# Patient Record
Sex: Male | Born: 1955 | Race: White | Hispanic: No | Marital: Married | Smoking: Current every day smoker
Health system: Southern US, Community
[De-identification: ages and names within clinical notes are randomized; demographics above are authoritative.]

## PROBLEM LIST (undated history)

## (undated) DIAGNOSIS — M549 Dorsalgia, unspecified: Secondary | ICD-10-CM

## (undated) DIAGNOSIS — M5430 Sciatica, unspecified side: Secondary | ICD-10-CM

## (undated) HISTORY — PX: NECK SURGERY: SHX720

---

## 2001-12-30 ENCOUNTER — Encounter: Payer: Self-pay | Admitting: Emergency Medicine

## 2001-12-30 ENCOUNTER — Emergency Department (HOSPITAL_COMMUNITY): Admission: EM | Admit: 2001-12-30 | Discharge: 2001-12-30 | Payer: Self-pay | Admitting: Emergency Medicine

## 2007-09-03 ENCOUNTER — Ambulatory Visit (HOSPITAL_COMMUNITY): Admission: RE | Admit: 2007-09-03 | Discharge: 2007-09-04 | Payer: Self-pay | Admitting: Neurosurgery

## 2010-05-16 NOTE — Op Note (Signed)
NAMEDRAYK, HUMBARGER                 ACCOUNT NO.:  [id number]   MEDICAL RECORD NO.:  [id number]          PATIENT TYPE:  OIB   LOCATION:  3534                         FACILITY:  [facility]   PHYSICIAN:  Hewitt Shorts, M.D.DATE OF BIRTH:  06-17-1955   DATE OF PROCEDURE:  DATE OF DISCHARGE:  09/04/2007                               OPERATIVE REPORT   PREOPERATIVE DIAGNOSES:  1. C3-C4 cervical disk herniation with myelopathy.  2. Cervical spondylosis with myelopathy.  3. Cervical stenosis.   POSTOPERATIVE DIAGNOSES:  1. C3-C4 cervical disk herniation with myelopathy.  2. Cervical spondylosis with myelopathy.  3. Cervical stenosis.   PROCEDURE:  C3-C4 anterior cervical decompression and arthrodesis with  allograft and tethered cervical plating.   SURGEON:  Hewitt Shorts, M.D.   ASSISTANT:  Dr. Venetia Maxon.   ANESTHESIA:  General endotracheal anesthesia.   INDICATIONS:  The patient is a 55 year old male who presented with  numbness and tingling of his hands bilaterally.  MRI scan was obtained  and showed large spondylitic disk herniation broad based at the C3-C4  level with significant spinal cord compression and significant increased  signal within the spinal cord.  Decision made to proceed with single  level decompression arthrodesis.   PROCEDURE:  The patient was brought to the operating room and placed  under general endotracheal anesthesia.  The patient was placed in 10-  pounds of Holter traction.  Neck was prepped with Betadine soap and  solution and draped in a sterile fashion.  The line of the incision was  infiltrated with local anesthetic with epinephrine.  The incision was  made in left side of his neck in a horizontal fashion.  Dissection was  carried down to the subcutaneous tissue and platysma.  Bipolar  electrocautery was used to maintain hemostasis.  Dissection was carried  down through an avascular plane leaving the sternocleidomastoid, carotid  artery, and  jugular vein laterally and trachea and esophagus medially.  The ventral aspect of the vertebral cord was identified, a localizing x-  ray taken, and the C3-C4 intervertebral disk space identified.  Diskectomy was begun at that level with incision of the annulus  continued micro curettes and pituitary rongeurs.  Anterior osteophytic  overgrowth was removed using Kerrison punches and the cauterized end  plates of the vertebrae were removed using micro curettes along with X-  Max drill.  The microscope was draped and brought into the field to  provide additional navigation, illumination, and visualization.  The  remainder of the decompression was performed using microdissection and  microsurgical technique.  There was significant spondylitic overgrowth  posteriorly.  This was removed using an X-Max drill along with 2-mm  Kerrison punch with a thin footplate.  The posterior large ligament was  carefully removed and we encountered fairly large soft disk herniation  as well as spondylitic disk herniation and all this was removed to  decompress the spinal canal and thecal sac.  Attention was then turned  to the lateral recess and neuroforamen to ensure that they were  similarly decompressed and once the decompression and diskectomy was  completed,  hemostasis was established with the use of a Gelfoam  substance thrombin.  We irrigated the Gelfoam away with gentle saline  irrigation.  Hemostasis was confirmed and then we measured the height of  the intervertebral disk space using sizers, selected a 7-mm implant.  The allograft implant was hydrated with saline solution then positioned  in the intervertebral disk space and countersunk.  We then discontinued  the cervical traction.  We selected a 14-mm Tether cervical plate.  It  was positioned over the fusion construct and secured to the vertebrae  with a pair of 4 mm x 13 mm variable-angle screws at each level.  Each  of the screw holes was drilled  and the screws were placed in alternating  fashion.  Once all four screws were in placed, final tightening was  performed.  An x-ray was taken which showed the graft, plates, and  screws in good position, the alignment was good and then the wound was  irrigated with Bacitracin solution and checked for hemostasis which was  confirmed and then we proceeded with closure.  The platysma was closed  with interrupted and inverted 2-0 undyed Vicryl sutures.  The  subcutaneous and subcuticular were closed with interrupted and inverted  3-0 undyed Vicryl sutures.  The skin was approximated with Dermabond.  The procedure was tolerated well.  The estimated blood loss was 50 mL.  Sponge and needle count were correct.  Following surgery, the patient  was placed in a soft cervical collar to be reversed from the anesthetic,  extubated, and transferred to the recovery room for further care.      Hewitt Shorts, M.D.  Electronically Signed     RWN/MEDQ  D:  09/03/2007  T:  09/05/2007  Job:  [id number]

## 2014-01-25 ENCOUNTER — Emergency Department (HOSPITAL_COMMUNITY)
Admission: EM | Admit: 2014-01-25 | Discharge: 2014-01-25 | Disposition: A | Discharge status: Home / Self Care | Payer: 59 | Attending: Emergency Medicine | Admitting: Emergency Medicine

## 2014-01-25 ENCOUNTER — Encounter (HOSPITAL_COMMUNITY): Payer: Self-pay | Admitting: Emergency Medicine

## 2014-01-25 ENCOUNTER — Emergency Department (HOSPITAL_COMMUNITY): Payer: 59

## 2014-01-25 DIAGNOSIS — Z720 Tobacco use: Secondary | ICD-10-CM | POA: Diagnosis not present

## 2014-01-25 DIAGNOSIS — M533 Sacrococcygeal disorders, not elsewhere classified: Secondary | ICD-10-CM

## 2014-01-25 DIAGNOSIS — Z79899 Other long term (current) drug therapy: Secondary | ICD-10-CM | POA: Insufficient documentation

## 2014-01-25 DIAGNOSIS — R109 Unspecified abdominal pain: Secondary | ICD-10-CM

## 2014-01-25 DIAGNOSIS — R1032 Left lower quadrant pain: Secondary | ICD-10-CM | POA: Diagnosis present

## 2014-01-25 DIAGNOSIS — K219 Gastro-esophageal reflux disease without esophagitis: Secondary | ICD-10-CM | POA: Insufficient documentation

## 2014-01-25 LAB — COMPREHENSIVE METABOLIC PANEL
ALT: 21 U/L (ref 0–53)
AST: 22 U/L (ref 0–37)
Albumin: 3.9 g/dL (ref 3.5–5.2)
Alkaline Phosphatase: 54 U/L (ref 39–117)
Anion gap: 6 (ref 5–15)
BUN: 13 mg/dL (ref 6–23)
CALCIUM: 9.4 mg/dL (ref 8.4–10.5)
CHLORIDE: 105 mmol/L (ref 96–112)
CO2: 28 mmol/L (ref 19–32)
Creatinine, Ser: 0.99 mg/dL (ref 0.50–1.35)
GFR calc Af Amer: >90 mL/min (ref >90)
GFR calc non Af Amer: 88 mL/min — ABNORMAL LOW (ref >90)
GLUCOSE: 112 mg/dL — AB (ref 70–99)
Potassium: 4.7 mmol/L (ref 3.5–5.1)
Sodium: 139 mmol/L (ref 135–145)
Total Bilirubin: 0.4 mg/dL (ref 0.3–1.2)
Total Protein: 6.3 g/dL (ref 6.0–8.3)

## 2014-01-25 LAB — CBC WITH DIFFERENTIAL/PLATELET
BASOS ABS: 0 10*3/uL (ref 0.0–0.1)
Basophils Relative: 0 % (ref 0–1)
EOS PCT: 2 % (ref 0–5)
Eosinophils Absolute: 0.1 10*3/uL (ref 0.0–0.7)
HCT: 47.2 % (ref 39.0–52.0)
Hemoglobin: 16 g/dL (ref 13.0–17.0)
Lymphocytes Relative: 22 % (ref 12–46)
Lymphs Abs: 1.6 10*3/uL (ref 0.7–4.0)
MCH: 32.8 pg (ref 26.0–34.0)
MCHC: 33.9 g/dL (ref 30.0–36.0)
MCV: 96.7 fL (ref 78.0–100.0)
Monocytes Absolute: 0.7 10*3/uL (ref 0.1–1.0)
Monocytes Relative: 10 % (ref 3–12)
Neutro Abs: 4.8 10*3/uL (ref 1.7–7.7)
Neutrophils Relative %: 66 % (ref 43–77)
Platelets: 225 10*3/uL (ref 150–400)
RBC: 4.88 MIL/uL (ref 4.22–5.81)
RDW: 13.2 % (ref 11.5–15.5)
WBC: 7.2 10*3/uL (ref 4.0–10.5)

## 2014-01-25 LAB — LIPASE, BLOOD: Lipase: 38 U/L (ref 11–59)

## 2014-01-25 LAB — URINALYSIS, ROUTINE W REFLEX MICROSCOPIC
Bilirubin Urine: NEGATIVE
GLUCOSE, UA: NEGATIVE mg/dL
Hgb urine dipstick: NEGATIVE
Ketones, ur: NEGATIVE mg/dL
LEUKOCYTES UA: NEGATIVE
NITRITE: NEGATIVE
PH: 5.5 (ref 5.0–8.0)
Protein, ur: NEGATIVE mg/dL
Specific Gravity, Urine: 1.011 (ref 1.005–1.030)
UROBILINOGEN UA: 0.2 mg/dL (ref 0.0–1.0)

## 2014-01-25 MED ORDER — PANTOPRAZOLE SODIUM 20 MG PO TBEC
20.0000 mg | DELAYED_RELEASE_TABLET | Freq: Every day | ORAL | Status: AC
Start: 2014-01-25 — End: ?

## 2014-01-25 MED ORDER — HYDROCODONE-ACETAMINOPHEN 5-325 MG PO TABS: 1.0000 | ORAL_TABLET | ORAL | Status: AC | PRN

## 2014-01-25 MED ORDER — PANTOPRAZOLE SODIUM 40 MG PO TBEC
40.0000 mg | DELAYED_RELEASE_TABLET | Freq: Once | ORAL | Status: AC
  Administered 2014-01-25: 40 mg via ORAL
  Filled 2014-01-25: qty 1

## 2014-01-25 MED ORDER — ONDANSETRON 4 MG PO TBDP: 4.0000 mg | ORAL_TABLET | Freq: Three times a day (TID) | ORAL | Status: AC | PRN

## 2014-01-25 MED ORDER — ONDANSETRON HCL 4 MG/2ML IJ SOLN
4.0000 mg | Freq: Once | INTRAMUSCULAR | Status: AC
  Administered 2014-01-25: 4 mg via INTRAVENOUS
  Filled 2014-01-25: qty 2

## 2014-01-25 MED ORDER — SODIUM CHLORIDE 0.9 % IV BOLUS (SEPSIS)
1000.0000 mL | Freq: Once | INTRAVENOUS | Status: AC
  Administered 2014-01-25: 1000 mL via INTRAVENOUS

## 2014-01-25 MED ORDER — KETOROLAC TROMETHAMINE 30 MG/ML IJ SOLN
30.0000 mg | Freq: Once | INTRAMUSCULAR | Status: AC
  Administered 2014-01-25: 30 mg via INTRAVENOUS
  Filled 2014-01-25: qty 1

## 2014-01-25 MED ORDER — GI COCKTAIL ~~LOC~~
30.0000 mL | Freq: Once | ORAL | Status: AC
  Administered 2014-01-25: 30 mL via ORAL
  Filled 2014-01-25: qty 30

## 2014-01-25 NOTE — ED Notes (Signed)
Resting quietly with eye closed. Easily arousable. Verbally responsive. Resp even and unlabored. ABC's intact. NAD noted.  

## 2014-01-25 NOTE — ED Notes (Signed)
Awake. Verbally responsive. Resp even and unlabored. No audible adventitious breath sounds noted. ABC's intact. Abd soft/nondistended but tender to palpate. BS (+) and active x4 quadrants. No N/V/D reported. 

## 2014-01-25 NOTE — ED Notes (Signed)
Awake. Verbally responsive. A/O x4. Resp even and unlabored. No audible adventitious breath sounds noted. ABC's intact.  

## 2014-01-25 NOTE — ED Notes (Signed)
Pt states that he began having a sharp pain in lower left flank yesterday afternoon. States that he went to bed and woke up in severe pain. Pt states that he has also had a dry cough tonight accompanied by severe acid reflux that has not been controlled with antacids. Denies any dysuria or trouble urinating.

## 2014-01-25 NOTE — ED Provider Notes (Addendum)
TIME SEEN: 3:15 AM  CHIEF COMPLAINT: Left flank pain  HPI: Pt is a 59 y.o. male with no significant past medical history other than tobacco use who presents to the emergency department with complaints of left flank pain that radiates into his left lower abdomen that started yesterday afternoon but then woke him from sleep tonight. He states in the pain gets very severe causes him to feel very nauseated causing him to have a dry cough accompanied with bitter, sour taste in his mouth similar to acid reflux. Denies any dysuria, hematuria. No fevers, chills. No vomiting or diarrhea. No bloody stool or melena. No penile discharge, testicular pain or swelling. No prior history of kidney stones. No midline spine tenderness or history of back injury. No numbness, tingling or focal weakness. No bowel or bladder incontinence. Denies chest pain or shortness of breath.  ROS: See HPI Constitutional: no fever  Eyes: no drainage  ENT: no runny nose   Cardiovascular:  no chest pain  Resp: no SOB  GI: no vomiting GU: no dysuria Integumentary: no rash  Allergy: no hives  Musculoskeletal: no leg swelling  Neurological: no slurred speech ROS otherwise negative  PAST MEDICAL HISTORY/PAST SURGICAL HISTORY:  History reviewed. No pertinent past medical history.  MEDICATIONS:  Prior to Admission medications   Medication Sig Start Date End Date Taking? Authorizing Provider  ALPRAZolam Prudy Feeler(XANAX) 0.25 MG tablet Take 0.25 mg by mouth at bedtime as needed for anxiety.   Yes Historical Provider, MD  calcium carbonate (TUMS - DOSED IN MG ELEMENTAL CALCIUM) 500 MG chewable tablet Chew 1 tablet by mouth once.   Yes Historical Provider, MD  ibuprofen (ADVIL,MOTRIN) 200 MG tablet Take 400 mg by mouth once.   Yes Historical Provider, MD    ALLERGIES:  No Known Allergies  SOCIAL HISTORY:  History  Substance Use Topics  . Smoking status: Current Every Day Smoker -- 0.50 packs/day    Types: Cigarettes  . Smokeless  tobacco: Never Used  . Alcohol Use: Yes     Comment: occasionally    FAMILY HISTORY: No family history on file.  EXAM: BP 144/85 mmHg  Pulse 94  Temp(Src) 97.8 F (36.6 C) (Oral)  Resp 20  SpO2 98% CONSTITUTIONAL: Alert and oriented and responds appropriately to questions. Well-appearing; well-nourished HEAD: Normocephalic EYES: Conjunctivae clear, PERRL ENT: normal nose; no rhinorrhea; moist mucous membranes; pharynx without lesions noted NECK: Supple, no meningismus, no LAD  CARD: RRR; S1 and S2 appreciated; no murmurs, no clicks, no rubs, no gallops RESP: Normal chest excursion without splinting or tachypnea; breath sounds clear and equal bilaterally; no wheezes, no rhonchi, no rales,  ABD/GI: Normal bowel sounds; non-distended; soft, non-tender, no rebound, no guarding BACK:  The back appears normal and is non-tender to palpation, there is no CVA tenderness; no midline spinal tennis or step-off or deformity; tender to palpation over the SI joint on the left side EXT: Normal ROM in all joints; non-tender to palpation; no edema; normal capillary refill; no cyanosis, equal pulses in all 4 extremities    SKIN: Normal color for age and race; warm NEURO: Moves all extremities equally, sensation to light touch intact diffusely, normal gait PSYCH: The patient's mood and manner are appropriate. Grooming and personal hygiene are appropriate.  MEDICAL DECISION MAKING: Patient here with left flank pain radiating into his left lower abdomen. Suspect kidney stone. We'll give IV fluids, Toradol, Zofran. We'll obtain labs, urinalysis, CT of his abdomen and pelvis without contrast.  ED PROGRESS: Patient's  urine shows no sign of infection. CT scan shows no kidney stone or other acute intra-abdominal or pelvic process. 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 He also has a 4 mm right pulmonary nodule. Discussed both of  these findings with the patient and the importance of outpatient follow-up. He has no right-sided abdominal pain, no right leg pain. 2+ DP pulses and radial pulses bilaterally. Labs unremarkable. I suspect his pain is secondary to sacroiliitis. We'll discharge home with prescription for Vicodin. He reports his heartburn is completely gone after GI cocktail, Zofran, Protonix.  Discussed return precautions. He verbalizes understanding and is comfortable with plan.        Layla Maw Giavonni Fonder, DO 01/25/14 [id number]  Layla Maw Samul Mcinroy, DO 01/25/14 1191

## 2014-01-25 NOTE — Discharge Instructions (Signed)
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 Both of these will need to be followed by her primary care physician. We're also providing you with vascular surgery follow-up information. If you develop severe pain in the right leg, a cold or blue leg, numbness in this leg, I recommend you return to the hospital immediately.   Sacroiliac Joint Dysfunction The sacroiliac joint connects the lower part of the spine (the sacrum) with the bones of the pelvis. CAUSES  Sometimes, there is no obvious reason for sacroiliac joint dysfunction. Other times, it may occur   During pregnancy.  After injury, such as:  Car accidents.  Sport-related injuries.  Work-related injuries.  Due to one leg being shorter than the other.  Due to other conditions that affect the joints, such as:  Rheumatoid arthritis.  Gout.  Psoriasis.  Joint infection (septic arthritis). SYMPTOMS  Symptoms may include:  Pain in the:  Lower back.  Buttocks.  Groin.  Thighs and legs.  Difficult sitting, standing, walking, lying, bending or lifting. DIAGNOSIS  A number of tests may be used to help diagnose the cause of sacroiliac joint dysfunction, including:  Imaging tests to look for other causes of pain, including:  MRI.  CT scan.  Bone scan.  Diagnostic injection: During a special x-ray (called fluoroscopy), a needle is put into the sacroiliac joint. A numbing medicine is injected into the joint. If the pain is improved or stopped, the diagnosis of sacroiliac joint dysfunction is more likely. TREATMENT  There are a number of types of treatment used for sacroiliac joint dysfunction, including:  Only take over-the-counter or prescription medicines for pain, discomfort, or fever as directed by your caregiver.  Medications to relax muscles.  Rest. Decreasing activity can help cut down on painful muscle spasms and allow the back to heal.  Application of heat or ice  to the lower back may improve muscle spasms and soothe pain.  Brace. A special back brace, called a sacroiliac belt, can help support the joint while your back is healing.  Physical therapy can help teach comfortable positions and exercises to strengthen muscles that support the sacroiliac joint.  Cortisone injections. Injections of steroid medicine into the joint can help decrease swelling and improve pain.  Hyaluronic acid injections. This chemical improves lubrication within the sacroiliac joint, thereby decreasing pain.  Radiofrequency ablation. A special needle is placed into the joint, where it burns away nerves that are carrying pain messages from the joint.  Surgery. Because pain occurs during movement of the joint, screws and plates may be installed in order to limit or prevent joint motion. HOME CARE INSTRUCTIONS   Take all medications exactly as directed.  Follow instructions regarding both rest and physical activity, to avoid worsening the pain.  Do physical therapy exercises exactly as prescribed. SEEK IMMEDIATE MEDICAL CARE IF:  You experience increasingly severe pain.  You develop new symptoms, such as numbness or tingling in your legs or feet.  You lose bladder or bowel control. Document Released: 03/16/2008 Document Revised: 03/12/2011 Document Reviewed: 03/16/2008 [facility] Patient Information 2015 [place], [place]. This information is not intended to replace advice given to you by your health care provider. Make sure you discuss any questions you have with your health care provider.     Heartburn Heartburn is a painful, burning sensation in the chest. It may feel worse in certain positions, such as lying down or bending over. It is caused by stomach acid backing up into  the tube that carries food from the mouth down to the stomach (lower esophagus).  CAUSES   Large meals.  Certain foods and drinks.  Exercise.  Increased acid production.  Being overweight  or obese.  Certain medicines. SYMPTOMS   Burning pain in the chest or lower throat.  Bitter taste in the mouth.  Coughing. DIAGNOSIS  If the usual treatments for heartburn do not improve your symptoms, then tests may be done to see if there is another condition present. Possible tests may include:  X-rays.  Endoscopy. This is when a tube with a light and a camera on the end is used to examine the esophagus and the stomach.  A test to measure the amount of acid in the esophagus (pH test).  A test to see if the esophagus is working properly (esophageal manometry).  Blood, breath, or stool tests to check for bacteria that cause ulcers. TREATMENT   Your caregiver may tell you to use certain over-the-counter medicines (antacids, acid reducers) for mild heartburn.  Your caregiver may prescribe medicines to decrease the acid in your stomach or protect your stomach lining.  Your caregiver may recommend certain diet changes.  For severe cases, your caregiver may recommend that the head of your bed be elevated on blocks. (Sleeping with more pillows is not an effective treatment as it only changes the position of your head and does not improve the main problem of stomach acid refluxing into the esophagus.) HOME CARE INSTRUCTIONS   Take all medicines as directed by your caregiver.  Raise the head of your bed by putting blocks under the legs if instructed to by your caregiver.  Do not exercise right after eating.  Avoid eating 2 or 3 hours before bed. Do not lie down right after eating.  Eat small meals throughout the day instead of 3 large meals.  Stop smoking if you smoke.  Maintain a healthy weight.  Identify foods and beverages that make your symptoms worse and avoid them. Foods you may want to avoid include:  Peppers.  Chocolate.  High-fat foods, including fried foods.  Spicy foods.  Garlic and onions.  Citrus fruits, including oranges, grapefruit, lemons, and  limes.  Food containing tomatoes or tomato products.  Mint.  Carbonated drinks, caffeinated drinks, and alcohol.  Vinegar. SEEK IMMEDIATE MEDICAL CARE IF:  You have severe chest pain that goes down your arm or into your jaw or neck.  You feel sweaty, dizzy, or lightheaded.  You are short of breath.  You vomit blood.  You have difficulty or pain with swallowing.  You have bloody or black, tarry stools.  You have episodes of heartburn more than 3 times a week for more than 2 weeks. MAKE SURE YOU:  Understand these instructions.  Will watch your condition.  Will get help right away if you are not doing well or get worse. Document Released: 05/06/2008 Document Revised: 03/12/2011 Document Reviewed: 06/04/2010 [facility] Patient Information 2015 [place], [place]. This information is not intended to replace advice given to you by your health care provider. Make sure you discuss any questions you have with your health care provider.

## 2014-01-27 ENCOUNTER — Telehealth: Payer: Self-pay

## 2014-01-27 NOTE — Telephone Encounter (Signed)
Phone call from pt's daughter.  Requesting an appt. ASAP for evaluation of an aneurysm.  Reported pt. 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  Noted size of aneurysm 1.6 cm.  Daughter reported that the pt. Has been awake the last 3 nights in pain, and requested an evaluation with vascular surgery.  Discussed with Dr. Imogene Burnhen.  Advised an aneurysm of 1.6 in Iliac artery is very small, and this would not be treated until it was at 3.5 cm; also stated the 1.6 cm. CIA aneurysm is unlikely the source of the pt's pain.  Advised to schedule appt. for evaluation in next 2-4 weeks.  Call placed to daughter.  Advised of Dr. Nicky Pughhen's recommendation. Recommended to daughter to call pt's PCP for further evaluation of his pain.  Informed daughter that the scheduler will call back with an appt. with Vascular Surgeon.  Verb. Understanding.

## 2014-02-11 ENCOUNTER — Encounter: Payer: PRIVATE HEALTH INSURANCE | Admitting: Vascular Surgery

## 2014-03-01 ENCOUNTER — Encounter: Payer: Self-pay | Admitting: Vascular Surgery

## 2014-03-02 ENCOUNTER — Encounter: Payer: PRIVATE HEALTH INSURANCE | Admitting: Vascular Surgery

## 2014-03-22 ENCOUNTER — Encounter: Payer: Self-pay | Admitting: Vascular Surgery

## 2014-03-23 ENCOUNTER — Encounter: Payer: PRIVATE HEALTH INSURANCE | Admitting: Vascular Surgery

## 2014-09-23 ENCOUNTER — Encounter (HOSPITAL_COMMUNITY): Payer: Self-pay | Admitting: Emergency Medicine

## 2014-09-23 ENCOUNTER — Emergency Department (HOSPITAL_COMMUNITY)
Admission: EM | Admit: 2014-09-23 | Discharge: 2014-09-23 | Disposition: A | Discharge status: Home / Self Care | Payer: PRIVATE HEALTH INSURANCE | Attending: Emergency Medicine | Admitting: Emergency Medicine

## 2014-09-23 ENCOUNTER — Emergency Department (HOSPITAL_COMMUNITY): Payer: PRIVATE HEALTH INSURANCE

## 2014-09-23 DIAGNOSIS — M542 Cervicalgia: Secondary | ICD-10-CM | POA: Insufficient documentation

## 2014-09-23 DIAGNOSIS — Z720 Tobacco use: Secondary | ICD-10-CM | POA: Insufficient documentation

## 2014-09-23 DIAGNOSIS — M545 Low back pain, unspecified: Secondary | ICD-10-CM

## 2014-09-23 DIAGNOSIS — M549 Dorsalgia, unspecified: Secondary | ICD-10-CM | POA: Diagnosis present

## 2014-09-23 HISTORY — DX: Sciatica, unspecified side: M54.30

## 2014-09-23 HISTORY — DX: Dorsalgia, unspecified: M54.9

## 2014-09-23 LAB — I-STAT CHEM 8, ED
BUN: 14 mg/dL (ref 6–20)
Calcium, Ion: 1.17 mmol/L (ref 1.12–1.23)
Chloride: 102 mmol/L (ref 101–111)
Creatinine, Ser: 0.9 mg/dL (ref 0.61–1.24)
Glucose, Bld: 103 mg/dL — ABNORMAL HIGH (ref 65–99)
HEMATOCRIT: 50 % (ref 39.0–52.0)
HEMOGLOBIN: 17 g/dL (ref 13.0–17.0)
POTASSIUM: 3.8 mmol/L (ref 3.5–5.1)
SODIUM: 137 mmol/L (ref 135–145)
TCO2: 23 mmol/L (ref 0–100)

## 2014-09-23 MED ORDER — KETOROLAC TROMETHAMINE 30 MG/ML IJ SOLN
30.0000 mg | Freq: Once | INTRAMUSCULAR | Status: AC
  Administered 2014-09-23: 30 mg via INTRAMUSCULAR

## 2014-09-23 MED ORDER — OXYCODONE-ACETAMINOPHEN 5-325 MG PO TABS: 2.0000 | ORAL_TABLET | ORAL | Status: AC | PRN

## 2014-09-23 MED ORDER — OXYCODONE-ACETAMINOPHEN 5-325 MG PO TABS: 2.0000 | ORAL_TABLET | ORAL | Status: DC | PRN

## 2014-09-23 MED ORDER — KETOROLAC TROMETHAMINE 60 MG/2ML IM SOLN
60.0000 mg | Freq: Once | INTRAMUSCULAR | Status: AC
  Administered 2014-09-23: 60 mg via INTRAMUSCULAR
  Filled 2014-09-23: qty 2

## 2014-09-23 MED ORDER — KETOROLAC TROMETHAMINE 30 MG/ML IJ SOLN
30.0000 mg | Freq: Once | INTRAMUSCULAR | Status: DC
  Filled 2014-09-23: qty 1

## 2014-09-23 MED ORDER — POLYETHYLENE GLYCOL 3350 17 G PO PACK: 17.0000 g | PACK | Freq: Every day | ORAL | Status: AC

## 2014-09-23 MED ORDER — HYDROMORPHONE HCL 1 MG/ML IJ SOLN
1.0000 mg | Freq: Once | INTRAMUSCULAR | Status: AC
  Administered 2014-09-23: 1 mg via INTRAMUSCULAR
  Filled 2014-09-23: qty 1

## 2014-09-23 MED ORDER — PREDNISONE 20 MG PO TABS: ORAL_TABLET | ORAL | Status: DC

## 2014-09-23 MED ORDER — CYCLOBENZAPRINE HCL 10 MG PO TABS
10.0000 mg | ORAL_TABLET | Freq: Once | ORAL | Status: AC
  Administered 2014-09-23: 10 mg via ORAL
  Filled 2014-09-23: qty 1

## 2014-09-23 MED ORDER — CYCLOBENZAPRINE HCL 10 MG PO TABS: 10.0000 mg | ORAL_TABLET | Freq: Two times a day (BID) | ORAL | Status: DC | PRN

## 2014-09-23 MED ORDER — PREDNISONE 20 MG PO TABS: ORAL_TABLET | ORAL | Status: AC

## 2014-09-23 MED ORDER — DOCUSATE SODIUM 100 MG PO CAPS: 100.0000 mg | ORAL_CAPSULE | Freq: Two times a day (BID) | ORAL | Status: AC

## 2014-09-23 MED ORDER — DOCUSATE SODIUM 100 MG PO CAPS: 100.0000 mg | ORAL_CAPSULE | Freq: Two times a day (BID) | ORAL | Status: DC

## 2014-09-23 MED ORDER — CYCLOBENZAPRINE HCL 10 MG PO TABS: 10.0000 mg | ORAL_TABLET | Freq: Two times a day (BID) | ORAL | Status: AC | PRN

## 2014-09-23 MED ORDER — OXYCODONE HCL 5 MG PO TABS
5.0000 mg | ORAL_TABLET | Freq: Once | ORAL | Status: AC
  Administered 2014-09-23: 5 mg via ORAL
  Filled 2014-09-23: qty 1

## 2014-09-23 NOTE — Discharge Instructions (Signed)
Herniated Disk  A herniated disk occurs when a disk in your spine bulges out too far. This condition is also called a ruptured disk or slipped disk. Your spine (backbone) is made up of bones called vertebrae. Between each pair of vertebrae is an oval disk with a soft, spongy center that acts as a shock absorber when you move. The spongy center is surrounded by a tough outer ring.  When you have a herniated disk, the spongy center of the disk bulges out or ruptures through the outer ring. A herniated disk can press on a nerve between your vertebrae and cause pain. A herniated disk can occur anywhere in your back or neck area, but the lower back is the most common spot.  CAUSES   In many cases, a herniated disk occurs just from getting older. As you age, the spongy insides of your disks tend to shrink and dry out. A herniated disk can result from gradual wear and tear. Injury or sudden strain can also cause a herniated disk.   RISK FACTORS  Aging is the main risk factor for a herniated disk. Other risk factors include:   Being a man between the ages of 30 and 50 years.   Having a job that requires heavy lifting, bending, or twisting.   Having a job that requires long hours of driving.   Not getting enough exercise.   Being overweight.   Smoking.  SIGNS AND SYMPTOMS   Signs and symptoms depend on which disk is herniated.   For a herniated disk in the lower back, you may have sharp pain in:   One part of your leg, hip, or buttocks.   The back of your calf.   The top or sole of your foot (sciatica).    For a herniated disk in the neck, you may feel pain:   When you move your neck.   Near or over your shoulder blade.   That moves to your upper arm, forearm, or fingers.    You may also have muscle weakness. It may be hard to:   Lift your leg or arm.   Stand on your toes.   Squeeze tightly with one of your hands.   Other symptoms can include:   Numbness or tingling in the affected areas of your  body.   Loss of bladder or bowel control. This is a rare but serious sign of a severe herniated disk in the lower back.  DIAGNOSIS   Your health care provider will do a physical exam. During this exam, you may have to move certain body parts or assume various positions. For example, your health care provider may do the straight-leg test. This is a good way to test for a herniated disk in your lower back. In this test, the health care provider lifts your leg while you lie on your back. This is to see if you feel pain down your leg. Your health care provider will also check for numbness or loss of feeling.   Your health care provider will also check your:   Reflexes.   Muscle strength.   Posture.   Other tests may be done to help in making a diagnosis. These may include:   An X-ray of the spine to rule out other causes of back pain.    Other imaging studies, such as an MRI or CT scan. This is to check whether the herniated disk is pressing on your spinal canal.   Electromyography (EMG). This test   checks the nerves that control muscles. It is sometimes used to identify the specific area of nerve involvement.   TREATMENT   In many cases, herniated disk symptoms go away over a period of days or weeks. You will most likely be free of symptoms in 3-4 months. Treatment may include the following:   The initial treatment for a herniated disk is ashort period of rest.   Bed rest is often limited to 1 or 2 days. Resting for too long delays recovery.   If you have a herniated disk in your lower back, you should avoid sitting as much as possible because sitting increases pressure on the disk.   Medicines. These may include:    Nonsteroidal anti-inflammatory drugs (NSAIDs).   Muscle relaxants for back spasms.   Narcotic pain medicine if your pain is very bad.    Steroid injections. You may need these along the involved nerve root to help control pain. The steroid is injected in the area of the herniated disk.  It helps by reducing swelling around the disk.   Physical therapy. This may include exercises to strengthen the muscles that help support your spine.    You may need surgery if other treatments do not work.   HOME CARE INSTRUCTIONS  Follow all your health care provider's instructions. These may include:   Take all medicines as directed by your health care provider.   Rest for 2 days and then start moving.   Do not sit or stand for long periods of time.   Maintain good posture when sitting and standing.   Avoid movements that cause pain, such as bending or lifting.   When you are able to start lifting things again:   Bend with your knees.   Keep your back straight.   Hold heavy objects close to your body.   If you are overweight, ask your health care provider to help you start a weight-loss program.   When you are able to start exercising, ask your health care provider how much and what type of exercise is best for you.   Work with a physical therapist on stretching and strengthening exercises for your back.   Do not wear high-heeled shoes.   Do not sleep on your belly.   Do not smoke.   Keep all follow-up visits as directed by your health care provider.  SEEK MEDICAL CARE IF:   You have back or neck pain that is not getting better after 4 weeks.   You have very bad pain in your back or neck.   You develop numbness, tingling, or weakness along with pain.  SEEK IMMEDIATE MEDICAL CARE IF:    You have numbness, tingling, or weakness that makes you unable to use your arms or legs.   You lose control of your bladder or bowels.   You have dizziness or fainting.   You have shortness of breath.   MAKE SURE YOU:    Understand these instructions.   Will watch your condition.   Will get help right away if you are not doing well or get worse.  Document Released: 12/16/1999 Document Revised: 05/04/2013 Document Reviewed: 11/21/2012  ExitCare Patient Information 2015 [employer]. This information  is not intended to replace advice given to you by your health care provider. Make sure you discuss any questions you have with your health care provider.    Constipation  Constipation is when a person has fewer than three bowel movements a week, has difficulty having   a bowel movement, or has stools that are dry, hard, or larger than normal. As people grow older, constipation is more common. If you try to fix constipation with medicines that make you have a bowel movement (laxatives), the problem may get worse. Long-term laxative use may cause the muscles of the colon to become weak. A low-fiber diet, not taking in enough fluids, and taking certain medicines may make constipation worse.   CAUSES    Certain medicines, such as antidepressants, pain medicine, iron supplements, antacids, and water pills.    Certain diseases, such as diabetes, irritable bowel syndrome (IBS), thyroid disease, or depression.    Not drinking enough water.    Not eating enough fiber-rich foods.    Stress or travel.    Lack of physical activity or exercise.    Ignoring the urge to have a bowel movement.    Using laxatives too much.   SIGNS AND SYMPTOMS    Having fewer than three bowel movements a week.    Straining to have a bowel movement.    Having stools that are hard, dry, or larger than normal.    Feeling full or bloated.    Pain in the lower abdomen.    Not feeling relief after having a bowel movement.   DIAGNOSIS   Your health care provider will take a medical history and perform a physical exam. Further testing may be done for severe constipation. Some tests may include:   A barium enema X-ray to examine your rectum, colon, and, sometimes, your small intestine.    A sigmoidoscopy to examine your lower colon.    A colonoscopy to examine your entire colon.  TREATMENT   Treatment will depend on the severity of your constipation and what is causing it. Some dietary treatments include drinking more fluids  and eating more fiber-rich foods. Lifestyle treatments may include regular exercise. If these diet and lifestyle recommendations do not help, your health care provider may recommend taking over-the-counter laxative medicines to help you have bowel movements. Prescription medicines may be prescribed if over-the-counter medicines do not work.   HOME CARE INSTRUCTIONS    Eat foods that have a lot of fiber, such as fruits, vegetables, whole grains, and beans.   Limit foods high in fat and processed sugars, such as french fries, hamburgers, cookies, candies, and soda.    A fiber supplement may be added to your diet if you cannot get enough fiber from foods.    Drink enough fluids to keep your urine clear or pale yellow.    Exercise regularly or as directed by your health care provider.    Go to the restroom when you have the urge to go. Do not hold it.    Only take over-the-counter or prescription medicines as directed by your health care provider. Do not take other medicines for constipation without talking to your health care provider first.   SEEK IMMEDIATE MEDICAL CARE IF:    You have bright red blood in your stool.    Your constipation lasts for more than 4 days or gets worse.    You have abdominal or rectal pain.    You have thin, pencil-like stools.    You have unexplained weight loss.  MAKE SURE YOU:    Understand these instructions.   Will watch your condition.   Will get help right away if you are not doing well or get worse.  Document Released: 09/16/2003 Document Revised: 12/23/2012 Document Reviewed: 09/29/2012  Exit[employer] Patient   Information 2015 [employer]. This information is not intended to replace advice given to you by your health care provider. Make sure you discuss any questions you have with your health care provider.

## 2014-09-23 NOTE — Progress Notes (Signed)
EDCM spoke to patient at bedside.  Patient confirms his pcp is Dr. Marjory Lies at [facility] in [place].  System updated.

## 2014-09-23 NOTE — ED Notes (Signed)
Pt in MRI.

## 2014-09-23 NOTE — ED Provider Notes (Addendum)
CSN: [id number]     Arrival date & time 09/23/14  1313 History   First MD Initiated Contact with Patient 09/23/14 1428     Chief Complaint  Patient presents with  . Back Pain     (Consider location/radiation/quality/duration/timing/severity/associated sxs/prior Treatment) HPI The patient has had occasional problems with back pain, but nothing severe like this. He reports normally he might get a sore lower back after golfing but it resolves within a day or so. The patient reports he's been in severe, unrelenting pain for 4 days now. He bent over on Saturday and something in his back began to hurt. Since then he hasn't been able to find a comfortable position and has intense pain in his central lower back is radiating out to the sides. He reports if he lies flat his back goes into spasms. He has been trying ibuprofen and muscle relaxers that were prescribed over the phone by his primary provider, he is not getting any relief. Patient reports also he's had decreased bowel movements. He reports his last bowel movement was approximately 4 days ago. He denies urinary problems with pain burning or urgency. Past Medical History  Diagnosis Date  . Back pain   . Sciatica    Past Surgical History  Procedure Laterality Date  . Neck surgery     No family history on file. Social History  Substance Use Topics  . Smoking status: Current Every Day Smoker -- 0.50 packs/day    Types: Cigarettes  . Smokeless tobacco: Never Used  . Alcohol Use: Yes     Comment: occasionally    Review of Systems 10 Systems reviewed and are negative for acute change except as noted in the HPI.    Allergies  Review of patient's allergies indicates no known allergies.  Home Medications   Prior to Admission medications   Medication Sig Start Date End Date Taking? Authorizing Provider  ALPRAZolam Prudy Feeler) 0.5 MG tablet Take 1 mg by mouth at bedtime as needed. 08/25/14  Yes Historical Provider, MD  cyclobenzaprine  (FLEXERIL) 10 MG tablet Take 10-20 mg by mouth every 4 (four) hours as needed for muscle spasms.  09/20/14  Yes Historical Provider, MD  ibuprofen (ADVIL,MOTRIN) 200 MG tablet Take 400 mg by mouth every 6 (six) hours as needed for moderate pain.    Yes Historical Provider, MD  HYDROcodone-acetaminophen (NORCO/VICODIN) 5-325 MG per tablet Take 1 tablet by mouth every 4 (four) hours as needed. Patient not taking: Reported on 09/23/2014 01/25/14   Kristen N Ward, DO  ondansetron (ZOFRAN ODT) 4 MG disintegrating tablet Take 1 tablet (4 mg total) by mouth every 8 (eight) hours as needed for nausea or vomiting. Patient not taking: Reported on 09/23/2014 01/25/14   Kristen N Ward, DO  pantoprazole (PROTONIX) 20 MG tablet Take 1 tablet (20 mg total) by mouth daily. Patient not taking: Reported on 09/23/2014 01/25/14   Kristen N Ward, DO   BP 118/87 mmHg  Pulse 102  Temp(Src) 97.6 F (36.4 C) (Oral)  Resp 20  SpO2 99% Physical Exam  Constitutional: He is oriented to person, place, and time. He appears well-developed and well-nourished.  Patient is a well-developed, well conditioned male. He is leaning against the edge of the stretcher in a very uncomfortable-appearing position. Intermittently he tries to shift his weight or stand and another position. He has no respiratory distress. His mental status is clear.  HENT:  Head: Normocephalic and atraumatic.  Eyes: EOM are normal. Pupils are equal, round, and reactive  to light.  Neck: Neck supple.  Cardiovascular: Normal rate, regular rhythm, normal heart sounds and intact distal pulses.   Pulmonary/Chest: Effort normal and breath sounds normal.  Abdominal: Soft. Bowel sounds are normal. He exhibits no distension. There is no tenderness.  Musculoskeletal: Normal range of motion. He exhibits tenderness. He exhibits no edema.  Patient appears to be having muscular spasms in his back. Attempting to lie in a supine position patient had extreme pain. He does have  intact muscular strength. He is able to stand in reposition himself with pain limiting factors. He does have intact flexion and extension of the feet and sensation intact to light touch. Back has minimally reproducible areas of discomfort in the central lumbar spine and paraspinous muscles in the L4-5 region. There is no rash on the back and no soft tissue abnormality  Neurological: He is alert and oriented to person, place, and time. He has normal strength. Coordination normal. GCS eye subscore is 4. GCS verbal subscore is 5. GCS motor subscore is 6.  Skin: Skin is warm, dry and intact.  Psychiatric: He has a normal mood and affect.    ED Course  Procedures (including critical care time) Labs Review Labs Reviewed - No data to display  Imaging Review No results found. I have personally reviewed and evaluated these images and lab results as part of my medical decision-making.   EKG Interpretation None      Recheck at 1540. Patient is significantly improved after Dilaudid and Toradol. He is now able to lie on the stretcher. He reports there is still some discomfort in his back but it is much more tolerable and he is now able to lie down and remain still.  MDM   Final diagnoses:  None   Diagnosis: Suspected herniated lumbar disc. Severe Low back pain. Patient has pain that at this point is suggestive of disc herniation. Plan will be for MRI. Patient's MRI will be followed up by Dr. Adela Lank. If no immediately surgical findings are noted on the MRI, I do feel patient will be adequately pain controlled for further outpatient treatment with prescriptions of prednisone taper, Percocet and Flexeril. Patient reports his last bowel movement was approximate 4 days ago. He is also given for special for Colace and MiraLAX as he will be using narcotic pain medications for back pain.    Arby Barrette, MD 09/23/14 1610  Arby Barrette, MD 09/23/14 [phone]

## 2014-09-23 NOTE — ED Notes (Signed)
Awake. Verbally responsive. A/O x4. Resp even and unlabored. No audible adventitious breath sounds noted. ABC's intact. Family at bedside. 

## 2014-09-23 NOTE — ED Notes (Signed)
Per pt, states he has been in bed for 5 days related to back pain-started on Saturday

## 2014-09-23 NOTE — ED Notes (Signed)
Pt reported having lower back s/p to bending over on Saturday and pain has gotten worse despite of taking muscle relaxants and Motrin. Pt reported no BM since Saturday and decrease appetite. Denies urinary problems.

## 2014-09-23 NOTE — ED Notes (Signed)
Previous noted put in by this nurse and Cala Bradford, RN

## 2014-09-23 NOTE — ED Notes (Signed)
Pt remains in MRI 

## 2017-03-13 IMAGING — MR MR LUMBAR SPINE W/O CM
4 of 5 series · 18 of 48 positions shown · non-contrast
Comparison: CT abdomen and pelvis 01/25/2014

CLINICAL DATA: Low back pain with gait difficulty.  Constipation.

EXAM:
MRI LUMBAR SPINE WITHOUT CONTRAST
TECHNIQUE: Multiplanar, multisequence MR imaging of the lumbar spine was
performed. No intravenous contrast was administered.

[Series 3: T1 · sagittal · 4.0mm · 0.55mm/px · 3 of 14 slices shown (1 of 2)]
[im 3/14]
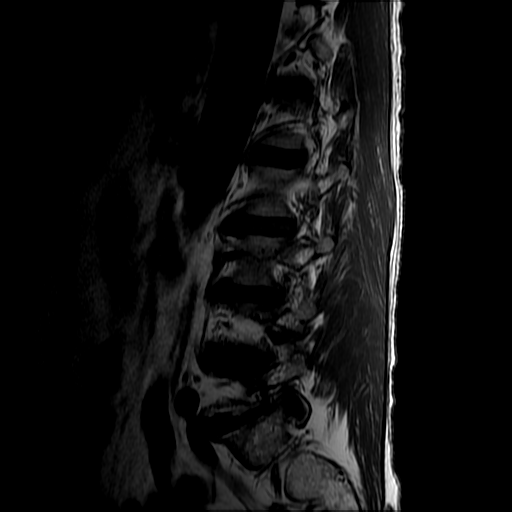
[im 8/14]
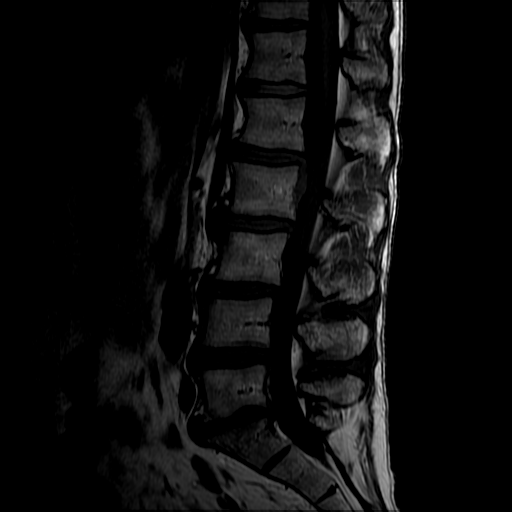
[im 14/14]
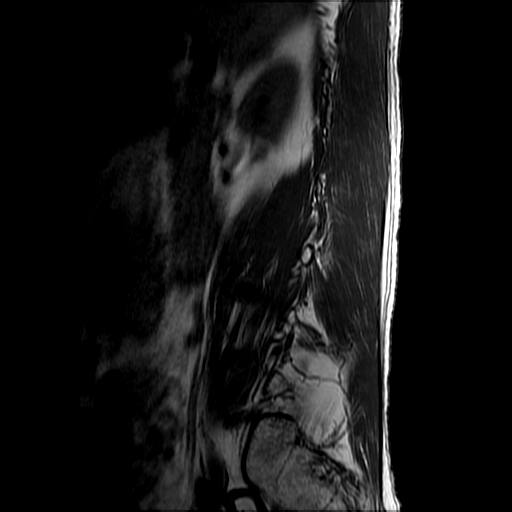

[Series 4: T2 post-contrast · sagittal · 4.0mm · 0.55mm/px · 5 of 14 slices shown]
[im 1/14]
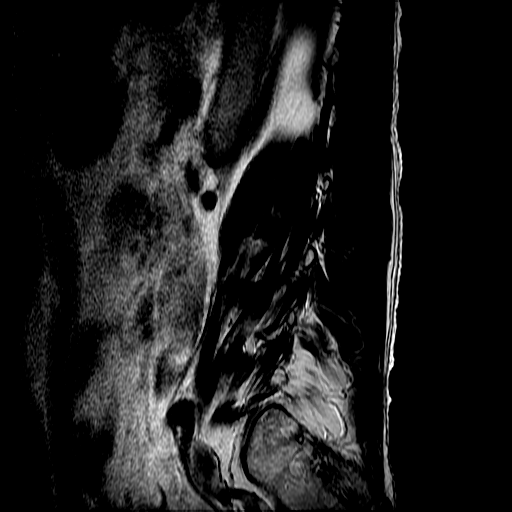
[im 4/14]
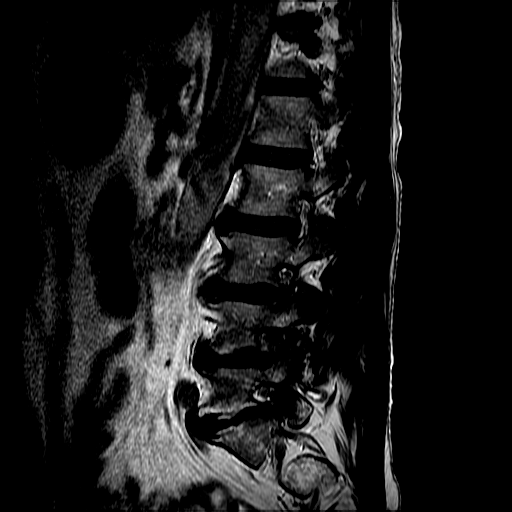
[im 7/14]
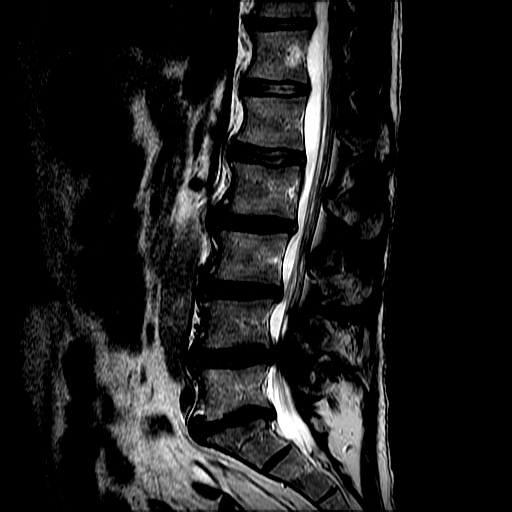
[im 10/14]
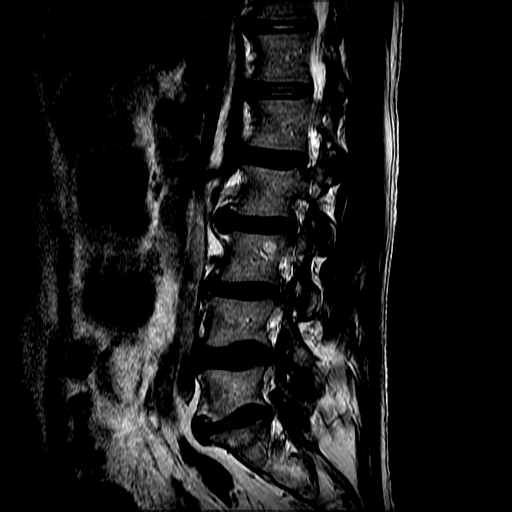
[im 14/14]
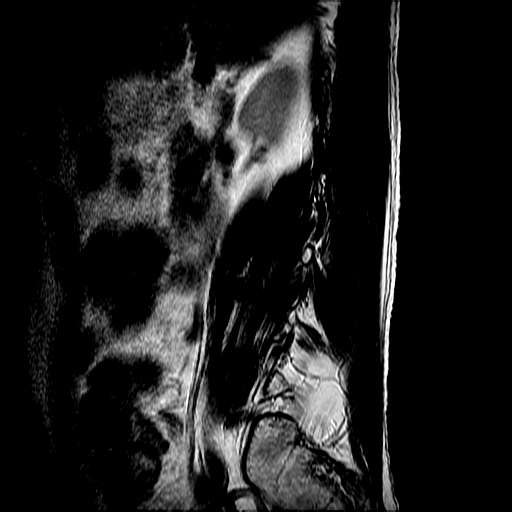

[Series 6: T2 · axial · 4.0mm · 0.39mm/px · z∈[-136,+46]mm · 7 of 43 slices shown]
[im 3/43]
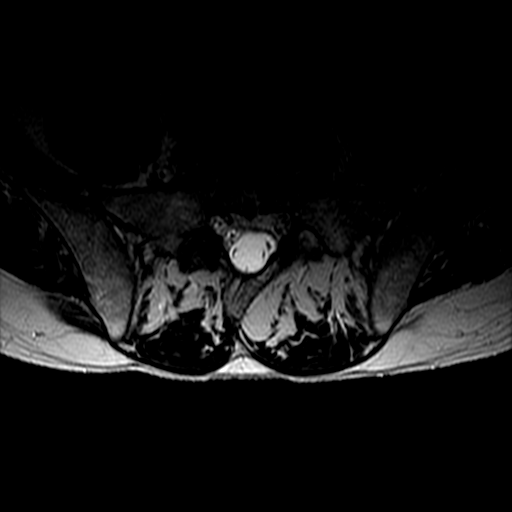
[im 6/43]
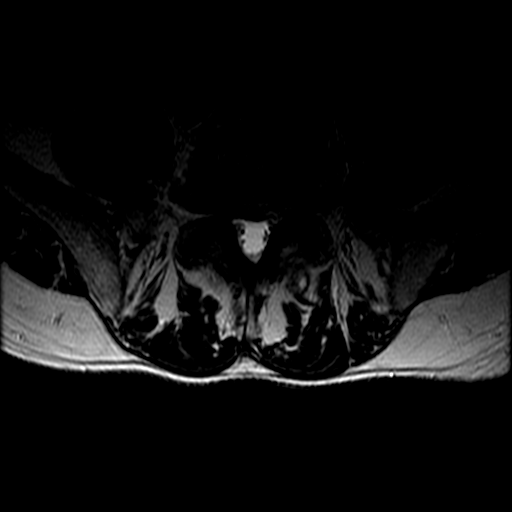
[im 9/43]
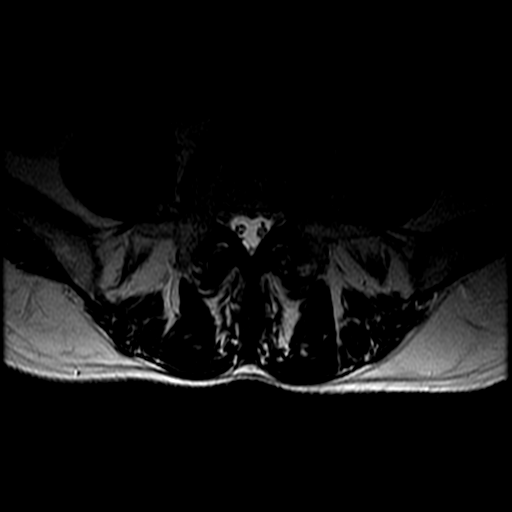
[im 15/43]
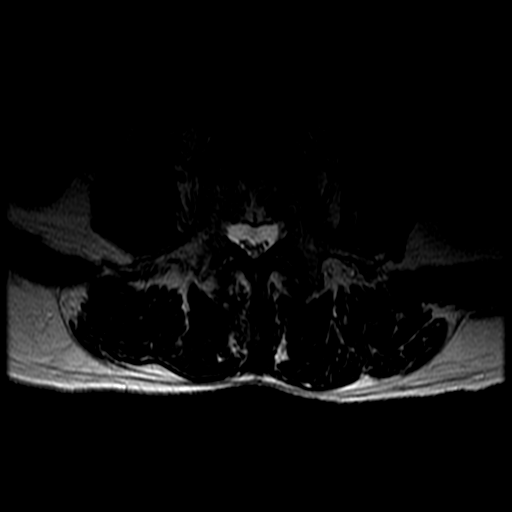
[im 20/43]
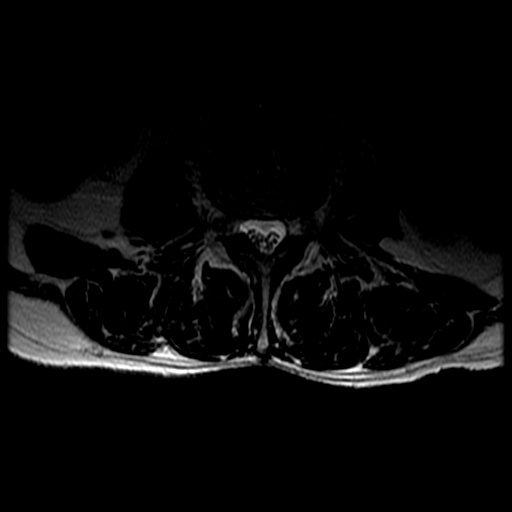
[im 23/43]
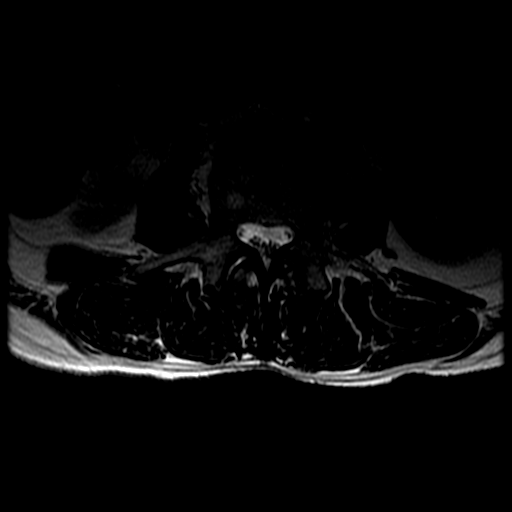
[im 37/43]
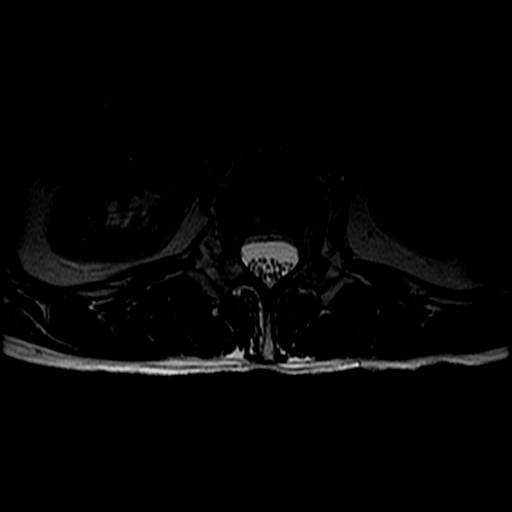

[Series 7: T1 · axial · 4.0mm · 0.39mm/px · z∈[-121,+46]mm · 3 of 43 slices shown (2 of 2)]
[im 6/43]
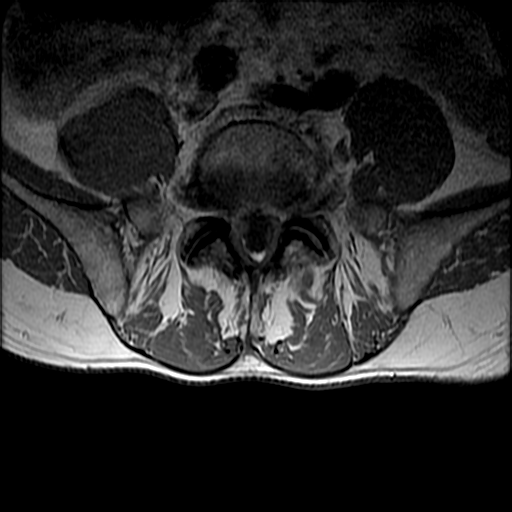
[im 23/43]
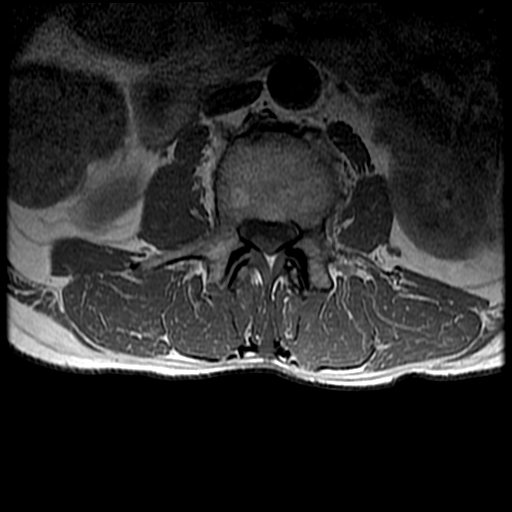
[im 37/43]
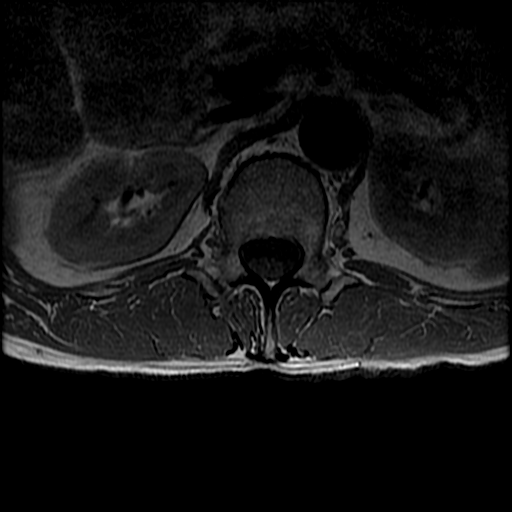

[18 of 48 positions shown; findings below may reference images not displayed]

FINDINGS: For the purposes of this dictation, the lowest well-formed
intervertebral disc space is presumed to be L5-S1.

There is trace retrolisthesis of L2 on L3, L3 on L4, L4 on L5, and
L5 on S1. Vertebral body heights are preserved. Disc desiccation and
mild disc space narrowing are present throughout the lumbar spine,
with moderate narrowing at L5-S1. Lumbar spinal canal is diffusely
small in caliber on a congenital basis due to short pedicles. Mild
degenerative endplate marrow changes are present in the lower lumbar
spine, with minimal edema at L5-S1. A few small hemangiomas are
noted.

Conus medullaris is normal in signal and terminates at T12-L1.
Subcentimeter bilateral renal T2 hyperintense lesions, the largest
measuring 8 mm on the right, are most compatible with cysts.

L1-2: Moderately large right paracentral disc extrusion with caudal
migration to the L2 pedicle level resulting in mild right lateral
recess stenosis and potentially affecting the right L2 nerve root.
There is mild disc bulging and mild facet hypertrophy with mild
spinal stenosis but no neural foraminal stenosis.

L2-3: Mild circumferential disc bulging and mild facet hypertrophy
result in mild spinal stenosis, mild bilateral lateral recess
stenosis, and no significant neural foraminal stenosis.

L3-4: Moderate circumferential disc bulging and mild facet
hypertrophy result in mild spinal stenosis, moderate bilateral
lateral recess stenosis, and mild right and moderate left neural
foraminal stenosis.

L4-5: Mild-to-moderate circumferential disc bulging and moderate
facet and ligamentum flavum hypertrophy result in moderate right and
severe left lateral recess stenosis, moderate spinal stenosis, and
moderate right and severe left neural foraminal stenosis.

L5-S1: Mild-to-moderate circumferential disc bulging, endplate
spurring, disc space height loss, and mild facet hypertrophy result
in mild bilateral lateral recess stenosis and moderate right greater
than left neural foraminal stenosis without spinal stenosis.
IMPRESSION: 1. Moderate multilevel lumbar disc and facet degeneration, most
notable at L4-5 where there is moderate spinal stenosis and moderate
to severe left greater than right neural foraminal and lateral
recess stenosis.
2. Moderately large disc extrusion at L1-2 contributing to mild
right lateral recess and mild spinal stenosis.

## 2019-03-27 ENCOUNTER — Ambulatory Visit: Payer: PRIVATE HEALTH INSURANCE | Attending: Internal Medicine

## 2019-03-27 DIAGNOSIS — Z23 Encounter for immunization: Secondary | ICD-10-CM

## 2019-03-27 NOTE — Progress Notes (Signed)
   Covid-19 Vaccination Clinic  Name:  FALLOU HULBERT    MRN: [id number] DOB: 06-27-1955  03/27/2019  Mr. Gunby was observed post Covid-19 immunization for 15 minutes without incident. He was provided with Vaccine Information Sheet and instruction to access the V-Safe system.   Mr. Szabo was instructed to call 911 with any severe reactions post vaccine: Marland Kitchen Difficulty breathing  . Swelling of face and throat  . A fast heartbeat  . A bad rash all over body  . Dizziness and weakness   Immunizations Administered    Name Date Dose VIS Date Route   Pfizer COVID-19 Vaccine 03/27/2019 11:07 AM 0.3 mL 12/12/2018 Intramuscular   Manufacturer: [employer], [employer]   Lot: [id number]   NDC: [address]-6

## 2019-04-13 ENCOUNTER — Ambulatory Visit: Payer: PRIVATE HEALTH INSURANCE

## 2019-04-21 ENCOUNTER — Ambulatory Visit: Payer: PRIVATE HEALTH INSURANCE | Attending: Internal Medicine

## 2019-04-21 DIAGNOSIS — Z23 Encounter for immunization: Secondary | ICD-10-CM

## 2019-04-21 NOTE — Progress Notes (Signed)
   Covid-19 Vaccination Clinic  Name:  Angel Moreno    MRN: [id number] DOB: Aug 25, 1955  04/21/2019  Mr. Angel Moreno was observed post Covid-19 immunization for 15 minutes without incident. He was provided with Vaccine Information Sheet and instruction to access the V-Safe system.   Mr. Angel Moreno was instructed to call 911 with any severe reactions post vaccine: Marland Kitchen Difficulty breathing  . Swelling of face and throat  . A fast heartbeat  . A bad rash all over body  . Dizziness and weakness   Immunizations Administered    Name Date Dose VIS Date Route   Pfizer COVID-19 Vaccine 04/21/2019 11:05 AM 0.3 mL 02/25/2018 Intramuscular   Manufacturer: [employer], [employer]   Lot: [id number]   NDC: [address]-6

## 2021-04-04 LAB — COLOGUARD: COLOGUARD: NEGATIVE

## 2023-09-26 ENCOUNTER — Ambulatory Visit (INDEPENDENT_AMBULATORY_CARE_PROVIDER_SITE_OTHER)

## 2023-09-26 ENCOUNTER — Ambulatory Visit: Admitting: Podiatry

## 2023-09-26 ENCOUNTER — Encounter: Payer: Self-pay | Admitting: Podiatry

## 2023-09-26 DIAGNOSIS — M7752 Other enthesopathy of left foot: Secondary | ICD-10-CM

## 2023-09-26 DIAGNOSIS — D2372 Other benign neoplasm of skin of left lower limb, including hip: Secondary | ICD-10-CM | POA: Diagnosis not present

## 2023-09-26 DIAGNOSIS — D239 Other benign neoplasm of skin, unspecified: Secondary | ICD-10-CM

## 2023-09-26 DIAGNOSIS — M6740 Ganglion, unspecified site: Secondary | ICD-10-CM | POA: Diagnosis not present

## 2023-09-26 DIAGNOSIS — M7989 Other specified soft tissue disorders: Secondary | ICD-10-CM

## 2023-09-26 NOTE — Progress Notes (Signed)
 Subjective:  Patient ID: Angel Moreno, male    DOB: 22-Dec-1955,  MRN: [id number] HPI Chief Complaint  Patient presents with   Cyst     Rm8 patient says he has a hx of ganglion cyst left ankle and nerve pain left hallux plantar.    68 y.o. male presents with the above complaint.   ROS: Denies fever chills nausea vomiting muscle aches pains calf pain back pain chest pain shortness of breath  Past Medical History:  Diagnosis Date   Back pain    Sciatica    Past Surgical History:  Procedure Laterality Date   NECK SURGERY      Current Outpatient Medications:    ALPRAZolam (XANAX) 0.5 MG tablet, Take 1 mg by mouth at bedtime as needed., Disp: , Rfl: 5   atorvastatin (LIPITOR) 20 MG tablet, TAKE 1/2 TABLET BY MOUTH EVERY DAY AT BEDTIME, Disp: , Rfl:    azithromycin (ZITHROMAX) 250 MG tablet, Take by mouth., Disp: , Rfl:    cyclobenzaprine  (FLEXERIL ) 10 MG tablet, Take 10-20 mg by mouth every 4 (four) hours as needed for muscle spasms. , Disp: , Rfl: 0   cyclobenzaprine  (FLEXERIL ) 10 MG tablet, Take 1 tablet (10 mg total) by mouth 2 (two) times daily as needed for muscle spasms., Disp: 20 tablet, Rfl: 0   docusate sodium  (COLACE) 100 MG capsule, Take 1 capsule (100 mg total) by mouth every 12 (twelve) hours., Disp: 60 capsule, Rfl: 0   ibuprofen (ADVIL,MOTRIN) 200 MG tablet, Take 400 mg by mouth every 6 (six) hours as needed for moderate pain. , Disp: , Rfl:    oxyCODONE -acetaminophen  (PERCOCET) 5-325 MG per tablet, Take 2 tablets by mouth every 4 (four) hours as needed., Disp: 20 tablet, Rfl: 0   polyethylene glycol (MIRALAX  / GLYCOLAX ) packet, Take 17 g by mouth daily., Disp: 14 each, Rfl: 0   predniSONE  (DELTASONE ) 20 MG tablet, 3 tabs po daily x 3 days, then 2 tabs x 3 days, then 1.5 tabs x 3 days, then 1 tab x 3 days, then 0.5 tabs x 3 days, Disp: 27 tablet, Rfl: 0   Vitamin D, Ergocalciferol, (DRISDOL) 1.25 MG (50000 UNIT) CAPS capsule, Take 50,000 Units by mouth., Disp: , Rfl:     HYDROcodone -acetaminophen  (NORCO/VICODIN) 5-325 MG per tablet, Take 1 tablet by mouth every 4 (four) hours as needed. (Patient not taking: Reported on 09/26/2023), Disp: 15 tablet, Rfl: 0   ondansetron  (ZOFRAN  ODT) 4 MG disintegrating tablet, Take 1 tablet (4 mg total) by mouth every 8 (eight) hours as needed for nausea or vomiting. (Patient not taking: Reported on 09/26/2023), Disp: 20 tablet, Rfl: 0   pantoprazole  (PROTONIX ) 20 MG tablet, Take 1 tablet (20 mg total) by mouth daily. (Patient not taking: Reported on 09/26/2023), Disp: 30 tablet, Rfl: 1  Allergies  Allergen Reactions   Zolpidem Nausea Only    unknown   Review of Systems Objective:  There were no vitals filed for this visit.  General: Well developed, nourished, in no acute distress, alert and oriented x3   Dermatological: Skin is warm, dry and supple bilateral. Nails x 10 are well maintained; remaining integument appears unremarkable at this time. There are no open sores, no preulcerative lesions, no rash or signs of infection present.  Ganglion cyst dorsal lateral aspect of the left foot just beneath the extensor digitorum brevis muscle belly.  Measures approximately 2 cm in diameter and is fluctuant nonpulsatile.  Also demonstrates a painful benign skin lesion plantar aspect H  IPJ left.  Palpable bursa or neuroma in the same area.  Vascular: Dorsalis Pedis artery and Posterior Tibial artery pedal pulses are 2/4 bilateral with immedate capillary fill time. Pedal hair growth present. No varicosities and no lower extremity edema present bilateral.   Neruologic: Grossly intact via light touch bilateral. Vibratory intact via tuning fork bilateral. Protective threshold with Semmes Wienstein monofilament intact to all pedal sites bilateral. Patellar and Achilles deep tendon reflexes 2+ bilateral. No Babinski or clonus noted bilateral.   Musculoskeletal: No gross boney pedal deformities bilateral. No pain, crepitus, or limitation noted  with foot and ankle range of motion bilateral. Muscular strength 5/5 in all groups tested bilateral.  Gait: Unassisted, Nonantalgic.    Radiographs:  Radiographs taken today demonstrate osseously mature individual there are multiple bone fragments throughout the MRI most notably anterior to the ankle joint.  This is from his years of taekwondo.  Assessment & Plan:   Assessment: Ganglion cyst dorsal lateral aspect left foot.  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  Plan: Injected 2 mg of dexamethasone H IPJ lesion.  Debrided benign skin lesion.  Localized the ganglion cyst with lidocaine.  Introduced a 18-gauge needle was able to remove approximately 2 cc ganglion mucoid type fluid and then reinjected 10 mg of Kenalog and placed a compression dressing.  Follow-up as needed     [name], [place]
